# Patient Record
Sex: Male | Born: 1940 | Race: White | Hispanic: No | Marital: Married | Smoking: Never smoker
Health system: Southern US, Community
[De-identification: ages and names within clinical notes are randomized; demographics above are authoritative.]

## PROBLEM LIST (undated history)

## (undated) DIAGNOSIS — I82409 Acute embolism and thrombosis of unspecified deep veins of unspecified lower extremity: Secondary | ICD-10-CM

## (undated) HISTORY — PX: HERNIA REPAIR: SHX51

---

## 1998-02-04 ENCOUNTER — Ambulatory Visit (HOSPITAL_COMMUNITY): Admission: RE | Admit: 1998-02-04 | Discharge: 1998-02-04 | Payer: Self-pay | Admitting: *Deleted

## 2000-04-09 ENCOUNTER — Encounter: Payer: Self-pay | Admitting: Urology

## 2000-04-16 ENCOUNTER — Inpatient Hospital Stay (HOSPITAL_COMMUNITY): Admission: RE | Admit: 2000-04-16 | Discharge: 2000-04-18 | Payer: Self-pay | Admitting: Urology

## 2002-05-11 ENCOUNTER — Ambulatory Visit (HOSPITAL_COMMUNITY): Admission: RE | Admit: 2002-05-11 | Discharge: 2002-05-11 | Payer: Self-pay | Admitting: Emergency Medicine

## 2004-05-31 ENCOUNTER — Ambulatory Visit: Payer: Self-pay | Admitting: *Deleted

## 2004-06-02 ENCOUNTER — Ambulatory Visit: Payer: Self-pay

## 2011-02-09 DIAGNOSIS — J029 Acute pharyngitis, unspecified: Secondary | ICD-10-CM | POA: Diagnosis not present

## 2011-02-09 DIAGNOSIS — J069 Acute upper respiratory infection, unspecified: Secondary | ICD-10-CM | POA: Diagnosis not present

## 2011-02-28 DIAGNOSIS — R05 Cough: Secondary | ICD-10-CM | POA: Diagnosis not present

## 2011-02-28 DIAGNOSIS — J329 Chronic sinusitis, unspecified: Secondary | ICD-10-CM | POA: Diagnosis not present

## 2011-02-28 DIAGNOSIS — R059 Cough, unspecified: Secondary | ICD-10-CM | POA: Diagnosis not present

## 2011-04-11 DIAGNOSIS — H40019 Open angle with borderline findings, low risk, unspecified eye: Secondary | ICD-10-CM | POA: Diagnosis not present

## 2011-09-30 DIAGNOSIS — J069 Acute upper respiratory infection, unspecified: Secondary | ICD-10-CM | POA: Diagnosis not present

## 2011-10-24 DIAGNOSIS — H40039 Anatomical narrow angle, unspecified eye: Secondary | ICD-10-CM | POA: Diagnosis not present

## 2011-10-24 DIAGNOSIS — H409 Unspecified glaucoma: Secondary | ICD-10-CM | POA: Diagnosis not present

## 2011-11-11 DIAGNOSIS — Z23 Encounter for immunization: Secondary | ICD-10-CM | POA: Diagnosis not present

## 2012-04-23 DIAGNOSIS — H2510 Age-related nuclear cataract, unspecified eye: Secondary | ICD-10-CM | POA: Diagnosis not present

## 2012-05-19 DIAGNOSIS — L03019 Cellulitis of unspecified finger: Secondary | ICD-10-CM | POA: Diagnosis not present

## 2012-05-19 DIAGNOSIS — L02519 Cutaneous abscess of unspecified hand: Secondary | ICD-10-CM | POA: Diagnosis not present

## 2012-10-29 DIAGNOSIS — H2510 Age-related nuclear cataract, unspecified eye: Secondary | ICD-10-CM | POA: Diagnosis not present

## 2012-10-29 DIAGNOSIS — H40039 Anatomical narrow angle, unspecified eye: Secondary | ICD-10-CM | POA: Diagnosis not present

## 2012-11-25 DIAGNOSIS — Z23 Encounter for immunization: Secondary | ICD-10-CM | POA: Diagnosis not present

## 2013-04-29 DIAGNOSIS — H2510 Age-related nuclear cataract, unspecified eye: Secondary | ICD-10-CM | POA: Diagnosis not present

## 2013-05-06 DIAGNOSIS — H2510 Age-related nuclear cataract, unspecified eye: Secondary | ICD-10-CM | POA: Diagnosis not present

## 2013-05-12 DIAGNOSIS — H2589 Other age-related cataract: Secondary | ICD-10-CM | POA: Diagnosis not present

## 2013-05-12 DIAGNOSIS — H269 Unspecified cataract: Secondary | ICD-10-CM | POA: Diagnosis not present

## 2013-05-12 DIAGNOSIS — H2510 Age-related nuclear cataract, unspecified eye: Secondary | ICD-10-CM | POA: Diagnosis not present

## 2013-05-20 DIAGNOSIS — H2510 Age-related nuclear cataract, unspecified eye: Secondary | ICD-10-CM | POA: Diagnosis not present

## 2013-05-26 DIAGNOSIS — H2510 Age-related nuclear cataract, unspecified eye: Secondary | ICD-10-CM | POA: Diagnosis not present

## 2013-05-26 DIAGNOSIS — H269 Unspecified cataract: Secondary | ICD-10-CM | POA: Diagnosis not present

## 2013-05-26 DIAGNOSIS — H2589 Other age-related cataract: Secondary | ICD-10-CM | POA: Diagnosis not present

## 2013-09-10 DIAGNOSIS — R972 Elevated prostate specific antigen [PSA]: Secondary | ICD-10-CM | POA: Diagnosis not present

## 2013-09-10 DIAGNOSIS — L723 Sebaceous cyst: Secondary | ICD-10-CM | POA: Diagnosis not present

## 2013-09-10 DIAGNOSIS — N529 Male erectile dysfunction, unspecified: Secondary | ICD-10-CM | POA: Diagnosis not present

## 2013-11-03 DIAGNOSIS — Z23 Encounter for immunization: Secondary | ICD-10-CM | POA: Diagnosis not present

## 2013-12-04 DIAGNOSIS — E785 Hyperlipidemia, unspecified: Secondary | ICD-10-CM | POA: Diagnosis not present

## 2013-12-04 DIAGNOSIS — Z125 Encounter for screening for malignant neoplasm of prostate: Secondary | ICD-10-CM | POA: Diagnosis not present

## 2013-12-11 DIAGNOSIS — R972 Elevated prostate specific antigen [PSA]: Secondary | ICD-10-CM | POA: Diagnosis not present

## 2013-12-11 DIAGNOSIS — N529 Male erectile dysfunction, unspecified: Secondary | ICD-10-CM | POA: Diagnosis not present

## 2013-12-11 DIAGNOSIS — N182 Chronic kidney disease, stage 2 (mild): Secondary | ICD-10-CM | POA: Diagnosis not present

## 2014-01-12 DIAGNOSIS — Z961 Presence of intraocular lens: Secondary | ICD-10-CM | POA: Diagnosis not present

## 2014-01-12 DIAGNOSIS — H26493 Other secondary cataract, bilateral: Secondary | ICD-10-CM | POA: Diagnosis not present

## 2014-09-24 ENCOUNTER — Emergency Department (HOSPITAL_BASED_OUTPATIENT_CLINIC_OR_DEPARTMENT_OTHER)
Admission: EM | Admit: 2014-09-24 | Discharge: 2014-09-24 | Disposition: A | Discharge status: Home / Self Care | Payer: Medicare Other | Attending: Emergency Medicine | Admitting: Emergency Medicine

## 2014-09-24 ENCOUNTER — Emergency Department (HOSPITAL_BASED_OUTPATIENT_CLINIC_OR_DEPARTMENT_OTHER): Payer: Medicare Other

## 2014-09-24 ENCOUNTER — Encounter (HOSPITAL_BASED_OUTPATIENT_CLINIC_OR_DEPARTMENT_OTHER): Payer: Self-pay | Admitting: *Deleted

## 2014-09-24 DIAGNOSIS — N201 Calculus of ureter: Secondary | ICD-10-CM

## 2014-09-24 DIAGNOSIS — N132 Hydronephrosis with renal and ureteral calculous obstruction: Secondary | ICD-10-CM | POA: Diagnosis not present

## 2014-09-24 DIAGNOSIS — R1032 Left lower quadrant pain: Secondary | ICD-10-CM | POA: Diagnosis present

## 2014-09-24 DIAGNOSIS — Z7982 Long term (current) use of aspirin: Secondary | ICD-10-CM | POA: Diagnosis not present

## 2014-09-24 DIAGNOSIS — Z86718 Personal history of other venous thrombosis and embolism: Secondary | ICD-10-CM | POA: Diagnosis not present

## 2014-09-24 HISTORY — DX: Acute embolism and thrombosis of unspecified deep veins of unspecified lower extremity: I82.409

## 2014-09-24 LAB — COMPREHENSIVE METABOLIC PANEL
ALT: 18 U/L (ref 17–63)
AST: 21 U/L (ref 15–41)
Albumin: 4.5 g/dL (ref 3.5–5.0)
Alkaline Phosphatase: 91 U/L (ref 38–126)
Anion gap: 9 (ref 5–15)
BUN: 22 mg/dL — ABNORMAL HIGH (ref 6–20)
CO2: 27 mmol/L (ref 22–32)
Calcium: 9.2 mg/dL (ref 8.9–10.3)
Chloride: 106 mmol/L (ref 101–111)
Creatinine, Ser: 1.15 mg/dL (ref 0.61–1.24)
GFR calc Af Amer: >60 mL/min (ref >60)
GFR calc non Af Amer: >60 mL/min (ref >60)
Glucose, Bld: 168 mg/dL — ABNORMAL HIGH (ref 65–99)
Potassium: 4 mmol/L (ref 3.5–5.1)
Sodium: 142 mmol/L (ref 135–145)
Total Bilirubin: 0.7 mg/dL (ref 0.3–1.2)
Total Protein: 7.4 g/dL (ref 6.5–8.1)

## 2014-09-24 LAB — URINE MICROSCOPIC-ADD ON

## 2014-09-24 LAB — CBC WITH DIFFERENTIAL/PLATELET
Basophils Absolute: 0 10*3/uL (ref 0.0–0.1)
Basophils Relative: 0 % (ref 0–1)
Eosinophils Absolute: 0 10*3/uL (ref 0.0–0.7)
Eosinophils Relative: 0 % (ref 0–5)
HCT: 43.2 % (ref 39.0–52.0)
Hemoglobin: 14.2 g/dL (ref 13.0–17.0)
Lymphocytes Relative: 11 % — ABNORMAL LOW (ref 12–46)
Lymphs Abs: 1.1 10*3/uL (ref 0.7–4.0)
MCH: 27.6 pg (ref 26.0–34.0)
MCHC: 32.9 g/dL (ref 30.0–36.0)
MCV: 83.9 fL (ref 78.0–100.0)
Monocytes Absolute: 0.5 10*3/uL (ref 0.1–1.0)
Monocytes Relative: 5 % (ref 3–12)
Neutro Abs: 8.8 10*3/uL — ABNORMAL HIGH (ref 1.7–7.7)
Neutrophils Relative %: 84 % — ABNORMAL HIGH (ref 43–77)
Platelets: 191 10*3/uL (ref 150–400)
RBC: 5.15 MIL/uL (ref 4.22–5.81)
RDW: 14.4 % (ref 11.5–15.5)
WBC: 10.5 10*3/uL (ref 4.0–10.5)

## 2014-09-24 LAB — URINALYSIS, ROUTINE W REFLEX MICROSCOPIC
Bilirubin Urine: NEGATIVE
Glucose, UA: NEGATIVE mg/dL
Ketones, ur: 15 mg/dL — AB
Nitrite: NEGATIVE
Protein, ur: 30 mg/dL — AB
Specific Gravity, Urine: 1.027 (ref 1.005–1.030)
Urobilinogen, UA: 1 mg/dL (ref 0.0–1.0)
pH: 5.5 (ref 5.0–8.0)

## 2014-09-24 LAB — LIPASE, BLOOD: Lipase: 16 U/L — ABNORMAL LOW (ref 22–51)

## 2014-09-24 MED ORDER — TAMSULOSIN HCL 0.4 MG PO CAPS: 0.4000 mg | ORAL_CAPSULE | Freq: Every day | ORAL | Status: AC

## 2014-09-24 MED ORDER — ONDANSETRON HCL 4 MG PO TABS: 4.0000 mg | ORAL_TABLET | Freq: Four times a day (QID) | ORAL | Status: DC

## 2014-09-24 MED ORDER — IOHEXOL 300 MG/ML  SOLN
25.0000 mL | Freq: Once | INTRAMUSCULAR | Status: AC | PRN
  Administered 2014-09-24: 25 mL via ORAL

## 2014-09-24 MED ORDER — MORPHINE SULFATE (PF) 4 MG/ML IV SOLN
4.0000 mg | Freq: Once | INTRAVENOUS | Status: AC
  Administered 2014-09-24: 4 mg via INTRAVENOUS
  Filled 2014-09-24: qty 1

## 2014-09-24 MED ORDER — KETOROLAC TROMETHAMINE 30 MG/ML IJ SOLN
15.0000 mg | Freq: Once | INTRAMUSCULAR | Status: AC
  Administered 2014-09-24: 15 mg via INTRAVENOUS
  Filled 2014-09-24: qty 1

## 2014-09-24 MED ORDER — MORPHINE SULFATE (PF) 4 MG/ML IV SOLN
4.0000 mg | Freq: Once | INTRAVENOUS | Status: DC
  Filled 2014-09-24: qty 1

## 2014-09-24 MED ORDER — MORPHINE SULFATE (PF) 4 MG/ML IV SOLN
4.0000 mg | Freq: Once | INTRAVENOUS | Status: AC
  Administered 2014-09-24: 4 mg via INTRAVENOUS

## 2014-09-24 MED ORDER — SODIUM CHLORIDE 0.9 % IV BOLUS (SEPSIS): 1000.0000 mL | Freq: Once | INTRAVENOUS | Status: DC

## 2014-09-24 MED ORDER — IBUPROFEN 800 MG PO TABS: 800.0000 mg | ORAL_TABLET | Freq: Three times a day (TID) | ORAL | Status: AC

## 2014-09-24 MED ORDER — OXYCODONE-ACETAMINOPHEN 5-325 MG PO TABS: 2.0000 | ORAL_TABLET | ORAL | Status: DC | PRN

## 2014-09-24 MED ORDER — ONDANSETRON HCL 4 MG/2ML IJ SOLN
4.0000 mg | Freq: Once | INTRAMUSCULAR | Status: AC
  Administered 2014-09-24: 4 mg via INTRAVENOUS
  Filled 2014-09-24: qty 2

## 2014-09-24 MED ORDER — IOHEXOL 300 MG/ML  SOLN
100.0000 mL | Freq: Once | INTRAMUSCULAR | Status: AC | PRN
  Administered 2014-09-24: 100 mL via INTRAVENOUS

## 2014-09-24 MED ORDER — SODIUM CHLORIDE 0.9 % IV BOLUS (SEPSIS)
1000.0000 mL | Freq: Once | INTRAVENOUS | Status: AC
  Administered 2014-09-24: 1000 mL via INTRAVENOUS

## 2014-09-24 NOTE — ED Notes (Signed)
Patient vomited approximately 278mL after returning from CT

## 2014-09-24 NOTE — ED Notes (Signed)
Reports LLQ pain since Monday- last BM yesterday was normal per pt report. Pt vomited x 3 in last hour. Also states had not voided since 0200 this morning

## 2014-09-24 NOTE — ED Provider Notes (Signed)
CSN: [id number]     Arrival date & time 09/24/14  1920 History  This chart was scribed for Ezequiel Essex, MD by Tula Nakayama, ED Scribe. This patient was seen in room MH04/MH04 and the patient's care was started at 7:47 PM.    Chief Complaint  Patient presents with  . Abdominal Pain  . Emesis   The history is provided by the patient. No language interpreter was used.   HPI Comments: James Lester is a 74 y.o. male with a history of hernia repair who presents to the Emergency Department complaining of constant, moderate LLQ abdominal pain that started 1 week ago and became worse 4 hours ago. Pt reports 3 episodes of vomiting and decreased urine as associated symptoms. He states that he has not urinated all day and has not felt the urge to urinate. Pt denies alleviating or relieving factors.Pt ate 3 hours ago, but was unable to tolerate food intake. His last BM was last night. Pt had left hernia repair 15 years ago, but does not know if mesh was placed. He takes 1 Aspirin daily. Pt denies CP, SOB, back pain, testicular pain and flatulence as associated symptoms.  PCP Noah Delaine  Past Medical History  Diagnosis Date  . DVT (deep venous thrombosis)    Past Surgical History  Procedure Laterality Date  . Hernia repair     No family history on file. Social History  Substance Use Topics  . Smoking status: Never Smoker   . Smokeless tobacco: Never Used  . Alcohol Use: No    Review of Systems  Respiratory: Negative for shortness of breath.   Cardiovascular: Negative for chest pain.  Gastrointestinal: Positive for vomiting and abdominal pain.  Genitourinary: Positive for decreased urine volume. Negative for testicular pain.  Musculoskeletal: Negative for back pain.  All other systems reviewed and are negative.  Allergies  Review of patient's allergies indicates no known allergies.  Home Medications   Prior to Admission medications   Medication Sig Start Date End Date Taking?  Authorizing Provider  aspirin 81 MG tablet Take 81 mg by mouth daily.   Yes Historical Provider, MD  ibuprofen (ADVIL,MOTRIN) 800 MG tablet Take 1 tablet (800 mg total) by mouth 3 (three) times daily. 09/24/14   Ezequiel Essex, MD  ondansetron (ZOFRAN) 4 MG tablet Take 1 tablet (4 mg total) by mouth every 6 (six) hours. 09/24/14   Ezequiel Essex, MD  oxyCODONE-acetaminophen (PERCOCET/ROXICET) 5-325 MG per tablet Take 2 tablets by mouth every 4 (four) hours as needed for severe pain. 09/24/14   Ezequiel Essex, MD  tamsulosin (FLOMAX) 0.4 MG CAPS capsule Take 1 capsule (0.4 mg total) by mouth daily. 09/24/14   Ezequiel Essex, MD   BP 139/68 mmHg  Pulse 68  Temp(Src) 98.2 F (36.8 C) (Oral)  Resp 16  Ht 5\' 11"  (1.803 m)  Wt 188 lb (85.276 kg)  BMI 26.23 kg/m2  SpO2 96% Physical Exam  Constitutional: He is oriented to person, place, and time. He appears well-developed and well-nourished. No distress.  HENT:  Head: Normocephalic and atraumatic.  Mouth/Throat: Oropharynx is clear and moist. No oropharyngeal exudate.  Eyes: Conjunctivae and EOM are normal. Pupils are equal, round, and reactive to light.  Neck: Normal range of motion. Neck supple.  No meningismus.  Cardiovascular: Normal rate, regular rhythm, normal heart sounds and intact distal pulses.   No murmur heard. Pulmonary/Chest: Effort normal and breath sounds normal. No respiratory distress.  Abdominal: Soft. There is tenderness. There is no  rebound, no guarding and no CVA tenderness.  TTP LLQ and inguinal area  Genitourinary:  Testicles non-tender Right testicle higher than left, pt states chronic  Musculoskeletal: Normal range of motion. He exhibits no edema or tenderness.  Neurological: He is alert and oriented to person, place, and time. No cranial nerve deficit. He exhibits normal muscle tone. Coordination normal.  No ataxia on finger to nose bilaterally. No pronator drift. 5/5 strength throughout. CN 2-12 intact. Negative  Romberg. Equal grip strength. Sensation intact. Gait is normal.   Skin: Skin is warm.  Psychiatric: He has a normal mood and affect. His behavior is normal.  Nursing note and vitals reviewed.   ED Course  Procedures   DIAGNOSTIC STUDIES: Oxygen Saturation is 98% on RA, normal by my interpretation.    COORDINATION OF CARE: 7:53 PM Discussed treatment plan with pt which includes lab work. Pt agreed to plan.  Labs Review Labs Reviewed  CBC WITH DIFFERENTIAL/PLATELET - Abnormal; Notable for the following:    Neutrophils Relative % 84 (*)    Neutro Abs 8.8 (*)    Lymphocytes Relative 11 (*)    All other components within normal limits  COMPREHENSIVE METABOLIC PANEL - Abnormal; Notable for the following:    Glucose, Bld 168 (*)    BUN 22 (*)    All other components within normal limits  URINALYSIS, ROUTINE W REFLEX MICROSCOPIC (NOT AT [facility]) - Abnormal; Notable for the following:    Color, Urine AMBER (*)    APPearance CLOUDY (*)    Hgb urine dipstick LARGE (*)    Ketones, ur 15 (*)    Protein, ur 30 (*)    Leukocytes, UA TRACE (*)    All other components within normal limits  LIPASE, BLOOD - Abnormal; Notable for the following:    Lipase 16 (*)    All other components within normal limits  URINE MICROSCOPIC-ADD ON - Abnormal; Notable for the following:    Squamous Epithelial / LPF FEW (*)    All other components within normal limits    Imaging Review Ct Abdomen Pelvis W Contrast  09/24/2014   CLINICAL DATA:  Lower abdominal pain for 1 week.  Nausea vomiting.  EXAM: CT ABDOMEN AND PELVIS WITH CONTRAST  TECHNIQUE: Multidetector CT imaging of the abdomen and pelvis was performed using the standard protocol following bolus administration of intravenous contrast.  CONTRAST:  61mL OMNIPAQUE IOHEXOL 300 MG/ML SOLN, 163mL OMNIPAQUE IOHEXOL 300 MG/ML SOLN  COMPARISON:  None.  FINDINGS: There is a 5 mm left ureteral calculus at the low L4 level with moderate hydroureter and hydronephrosis.  There are at least 2 upper pole left collecting system calculi measuring 2 mm each. No right-sided urinary calculi are evident.  There are normal appearances of the liver, gallbladder, pancreas, spleen, adrenals and right kidney. There is a small hiatal hernia. Remainder of the bowel is unremarkable. The abdominal aorta is normal in caliber without atherosclerotic calcification. 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 There is no significant abnormality in the lower chest. There is mild benign appearing anterior wedging of T12. There is moderate lumbar degenerative disc and facet change, greatest at L4-5. No significant musculoskeletal lesions are evident. There is a tiny fat containing umbilical hernia.  IMPRESSION: 1. 5 mm obstructing left ureteral calculus at the low L4 level. Moderate hydronephrosis. 2. Left nephrolithiasis. 3. 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 4. Tiny fat containing umbilical hernia.  Electronically Signed   By: Andreas Newport M.D.   On: 09/24/2014 22:07     EKG Interpretation None      MDM   Final diagnoses:  Ureteral stone   Left-sided abdominal pain with nausea and vomiting. History of inguinal hernia repair.  UA with hematuria. No infection. 100 cc on bladder scan.  CT scan shows 5 mm mid ureteral stone with hydronephrosis. Creatinine is normal. UA is not infected.  Discussed with on-call urology Dr. Nyoka Cowden. He feels patient can be discharged if symptoms are controlled.  Patient tolerating by mouth and pain is controlled. He wishes to go home. Admission offered and declined. Instructed to call for urology follow-up tomorrow. Return precautions discussed including worsening pain, vomiting, fever, uncontrolled symptoms.  I personally performed the services described in this documentation, which was scribed in my presence. The recorded information has been reviewed and is  accurate.   Ezequiel Essex, MD 09/25/14 Laureen Abrahams

## 2014-09-24 NOTE — ED Notes (Signed)
MD at bedside. 

## 2014-09-24 NOTE — ED Notes (Signed)
Patient given water for PO challenge.  

## 2014-09-24 NOTE — Discharge Instructions (Signed)
Kidney Stones Follow up with the urologist. Return to the ED with worsening pain, vomiting, fevers or any other concerns. Kidney stones (urolithiasis) are deposits that form inside your kidneys. The intense pain is caused by the stone moving through the urinary tract. When the stone moves, the ureter goes into spasm around the stone. The stone is usually passed in the urine.  CAUSES   A disorder that makes certain neck glands produce too much parathyroid hormone (primary hyperparathyroidism).  A buildup of uric acid crystals, similar to gout in your joints.  Narrowing (stricture) of the ureter.  A kidney obstruction present at birth (congenital obstruction).  Previous surgery on the kidney or ureters.  Numerous kidney infections. SYMPTOMS   Feeling sick to your stomach (nauseous).  Throwing up (vomiting).  Blood in the urine (hematuria).  Pain that usually spreads (radiates) to the groin.  Frequency or urgency of urination. DIAGNOSIS   Taking a history and physical exam.  Blood or urine tests.  CT scan.  Occasionally, an examination of the inside of the urinary bladder (cystoscopy) is performed. TREATMENT   Observation.  Increasing your fluid intake.  Extracorporeal shock wave lithotripsy--This is a noninvasive procedure that uses shock waves to break up kidney stones.  Surgery may be needed if you have severe pain or persistent obstruction. There are various surgical procedures. Most of the procedures are performed with the use of small instruments. Only small incisions are needed to accommodate these instruments, so recovery time is minimized. The size, location, and chemical composition are all important variables that will determine the proper choice of action for you. Talk to your health care provider to better understand your situation so that you will minimize the risk of injury to yourself and your kidney.  HOME CARE INSTRUCTIONS   Drink enough water and fluids  to keep your urine clear or pale yellow. This will help you to pass the stone or stone fragments.  Strain all urine through the provided strainer. Keep all particulate matter and stones for your health care provider to see. The stone causing the pain may be as small as a grain of salt. It is very important to use the strainer each and every time you pass your urine. The collection of your stone will allow your health care provider to analyze it and verify that a stone has actually passed. The stone analysis will often identify what you can do to reduce the incidence of recurrences.  Only take over-the-counter or prescription medicines for pain, discomfort, or fever as directed by your health care provider.  Make a follow-up appointment with your health care provider as directed.  Get follow-up X-rays if required. The absence of pain does not always mean that the stone has passed. It may have only stopped moving. If the urine remains completely obstructed, it can cause loss of kidney function or even complete destruction of the kidney. It is your responsibility to make sure X-rays and follow-ups are completed. Ultrasounds of the kidney can show blockages and the status of the kidney. Ultrasounds are not associated with any radiation and can be performed easily in a matter of minutes. SEEK MEDICAL CARE IF:  You experience pain that is progressive and unresponsive to any pain medicine you have been prescribed. SEEK IMMEDIATE MEDICAL CARE IF:   Pain cannot be controlled with the prescribed medicine.  You have a fever or shaking chills.  The severity or intensity of pain increases over 18 hours and is not relieved by  pain medicine.  You develop a new onset of abdominal pain.  You feel faint or pass out.  You are unable to urinate. MAKE SURE YOU:   Understand these instructions.  Will watch your condition.  Will get help right away if you are not doing well or get worse. Document Released:  01/09/2005 Document Revised: 09/11/2012 Document Reviewed: 06/12/2012 [facility] Patient Information 2015 [place], [place]. This information is not intended to replace advice given to you by your health care provider. Make sure you discuss any questions you have with your health care provider.

## 2014-09-29 DIAGNOSIS — N1330 Unspecified hydronephrosis: Secondary | ICD-10-CM | POA: Diagnosis not present

## 2014-09-29 DIAGNOSIS — N201 Calculus of ureter: Secondary | ICD-10-CM | POA: Diagnosis not present

## 2014-10-12 DIAGNOSIS — N201 Calculus of ureter: Secondary | ICD-10-CM | POA: Diagnosis not present

## 2014-11-02 DIAGNOSIS — N201 Calculus of ureter: Secondary | ICD-10-CM | POA: Diagnosis not present

## 2014-12-14 DIAGNOSIS — E663 Overweight: Secondary | ICD-10-CM | POA: Diagnosis not present

## 2014-12-14 DIAGNOSIS — Z0000 Encounter for general adult medical examination without abnormal findings: Secondary | ICD-10-CM | POA: Diagnosis not present

## 2014-12-14 DIAGNOSIS — E785 Hyperlipidemia, unspecified: Secondary | ICD-10-CM | POA: Diagnosis not present

## 2014-12-14 DIAGNOSIS — Z125 Encounter for screening for malignant neoplasm of prostate: Secondary | ICD-10-CM | POA: Diagnosis not present

## 2014-12-21 DIAGNOSIS — Z125 Encounter for screening for malignant neoplasm of prostate: Secondary | ICD-10-CM | POA: Diagnosis not present

## 2014-12-21 DIAGNOSIS — N529 Male erectile dysfunction, unspecified: Secondary | ICD-10-CM | POA: Diagnosis not present

## 2014-12-21 DIAGNOSIS — N182 Chronic kidney disease, stage 2 (mild): Secondary | ICD-10-CM | POA: Diagnosis not present

## 2014-12-21 DIAGNOSIS — R972 Elevated prostate specific antigen [PSA]: Secondary | ICD-10-CM | POA: Diagnosis not present

## 2015-01-15 DIAGNOSIS — H26493 Other secondary cataract, bilateral: Secondary | ICD-10-CM | POA: Diagnosis not present

## 2015-01-15 DIAGNOSIS — H02831 Dermatochalasis of right upper eyelid: Secondary | ICD-10-CM | POA: Diagnosis not present

## 2015-01-15 DIAGNOSIS — H02834 Dermatochalasis of left upper eyelid: Secondary | ICD-10-CM | POA: Diagnosis not present

## 2015-09-22 ENCOUNTER — Other Ambulatory Visit: Payer: Self-pay

## 2015-10-22 DIAGNOSIS — K045 Chronic apical periodontitis: Secondary | ICD-10-CM | POA: Diagnosis not present

## 2015-10-25 DIAGNOSIS — Z23 Encounter for immunization: Secondary | ICD-10-CM | POA: Diagnosis not present

## 2015-11-05 DIAGNOSIS — N200 Calculus of kidney: Secondary | ICD-10-CM | POA: Diagnosis not present

## 2015-11-05 DIAGNOSIS — N400 Enlarged prostate without lower urinary tract symptoms: Secondary | ICD-10-CM | POA: Diagnosis not present

## 2015-12-15 DIAGNOSIS — R946 Abnormal results of thyroid function studies: Secondary | ICD-10-CM | POA: Diagnosis not present

## 2015-12-15 DIAGNOSIS — Z125 Encounter for screening for malignant neoplasm of prostate: Secondary | ICD-10-CM | POA: Diagnosis not present

## 2015-12-15 DIAGNOSIS — R972 Elevated prostate specific antigen [PSA]: Secondary | ICD-10-CM | POA: Diagnosis not present

## 2015-12-15 DIAGNOSIS — N529 Male erectile dysfunction, unspecified: Secondary | ICD-10-CM | POA: Diagnosis not present

## 2015-12-15 DIAGNOSIS — N182 Chronic kidney disease, stage 2 (mild): Secondary | ICD-10-CM | POA: Diagnosis not present

## 2015-12-22 DIAGNOSIS — Z0000 Encounter for general adult medical examination without abnormal findings: Secondary | ICD-10-CM | POA: Diagnosis not present

## 2015-12-22 DIAGNOSIS — R946 Abnormal results of thyroid function studies: Secondary | ICD-10-CM | POA: Diagnosis not present

## 2015-12-22 DIAGNOSIS — N182 Chronic kidney disease, stage 2 (mild): Secondary | ICD-10-CM | POA: Diagnosis not present

## 2015-12-22 DIAGNOSIS — N529 Male erectile dysfunction, unspecified: Secondary | ICD-10-CM | POA: Diagnosis not present

## 2015-12-22 DIAGNOSIS — R972 Elevated prostate specific antigen [PSA]: Secondary | ICD-10-CM | POA: Diagnosis not present

## 2016-01-21 DIAGNOSIS — H26493 Other secondary cataract, bilateral: Secondary | ICD-10-CM | POA: Diagnosis not present

## 2016-01-21 DIAGNOSIS — H02834 Dermatochalasis of left upper eyelid: Secondary | ICD-10-CM | POA: Diagnosis not present

## 2016-01-21 DIAGNOSIS — H02831 Dermatochalasis of right upper eyelid: Secondary | ICD-10-CM | POA: Diagnosis not present

## 2016-05-15 IMAGING — CT CT ABD-PELV W/ CM
1 of 3 series · 14 of 32 positions shown, 19 images · IV contrast (omnipaque)
Comparison: None.

CLINICAL DATA: Lower abdominal pain for 1 week.  Nausea vomiting.

EXAM:
CT ABDOMEN AND PELVIS WITH CONTRAST
TECHNIQUE: Multidetector CT imaging of the abdomen and pelvis was performed
using the standard protocol following bolus administration of
intravenous contrast.
CONTRAST:  25mL OMNIPAQUE IOHEXOL 300 MG/ML SOLN, 100mL OMNIPAQUE
IOHEXOL 300 MG/ML SOLN

[Series 2: abd/pelvis 5.0 b31f · axial · 0.72mm/px · z∈[-470,-30]mm · 14 of 100 slices shown, 19 images]
[im 6/100  soft-tissue]
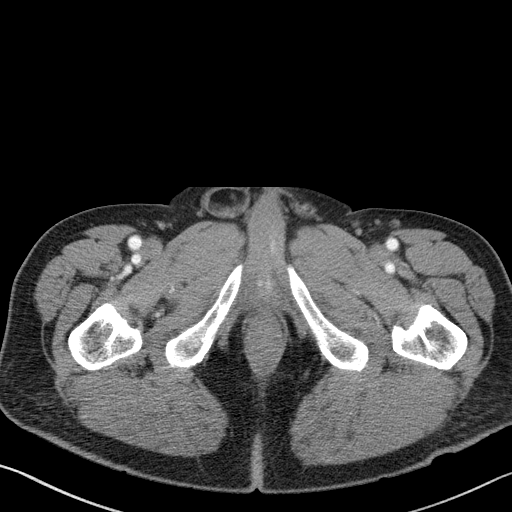
[im 6/100  bone]
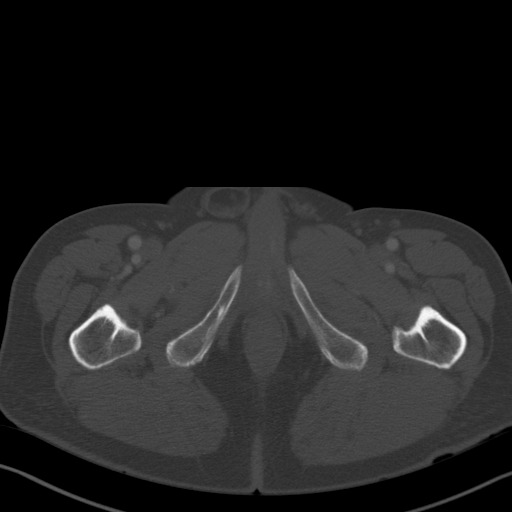
[im 16/100  soft-tissue]
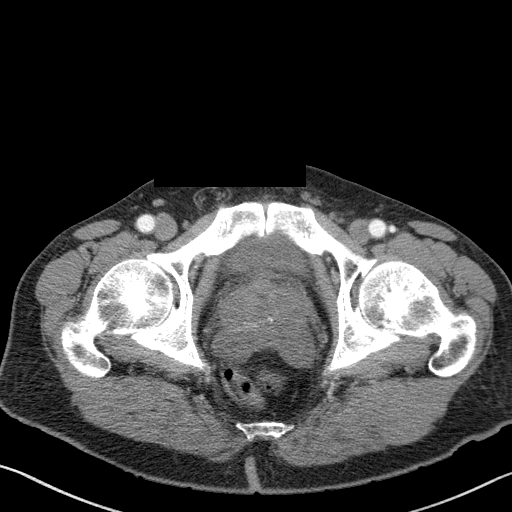
[im 21/100  soft-tissue]
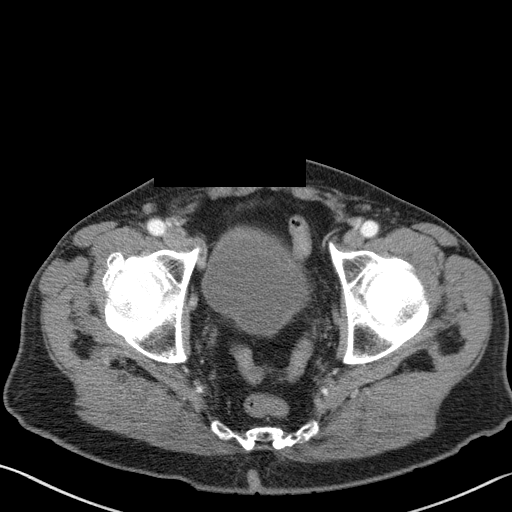
[im 27/100  soft-tissue]
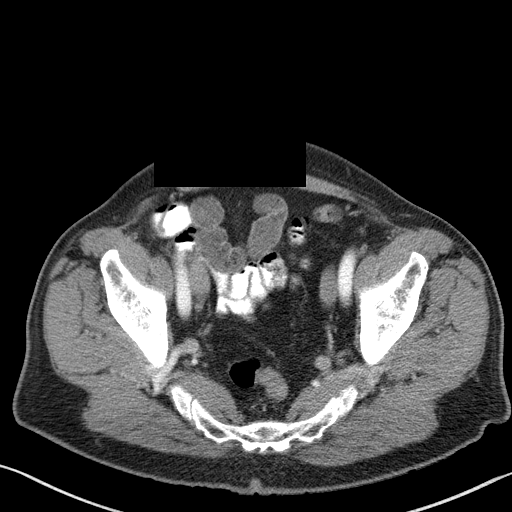
[im 37/100  soft-tissue]
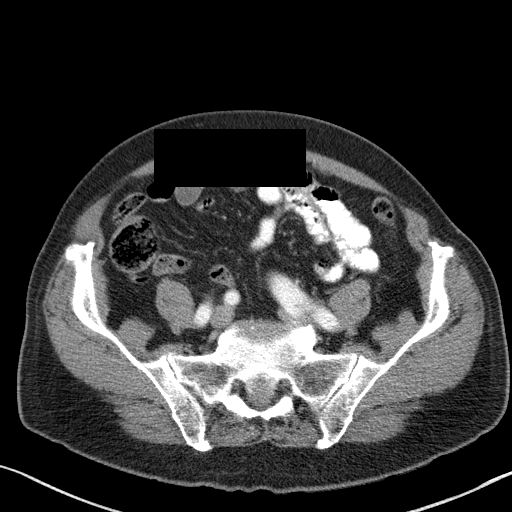
[im 42/100  soft-tissue]
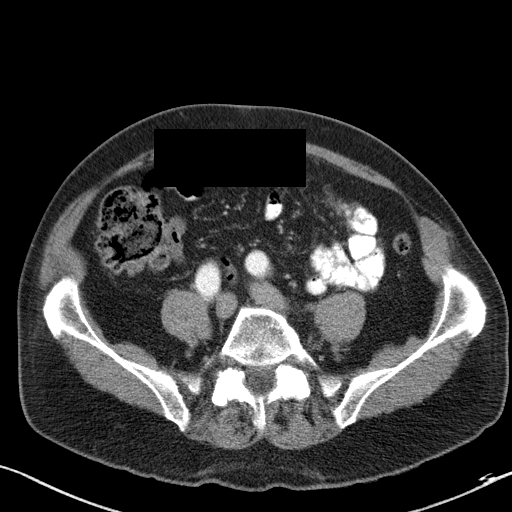
[im 53/100  soft-tissue]
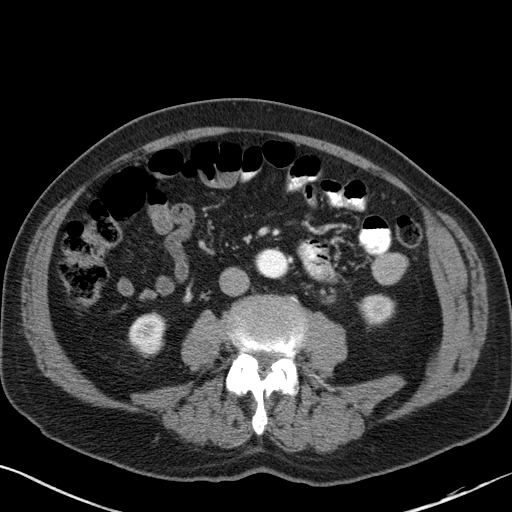
[im 58/100  soft-tissue]
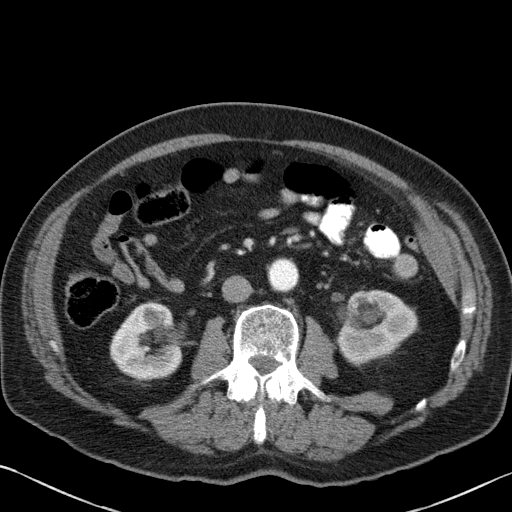
[im 63/100  soft-tissue]
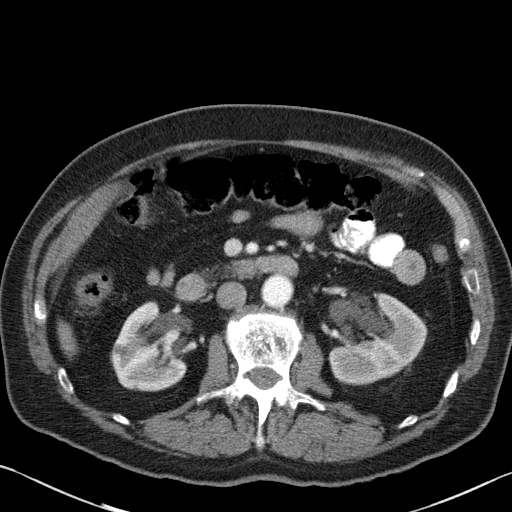
[im 63/100  bone]
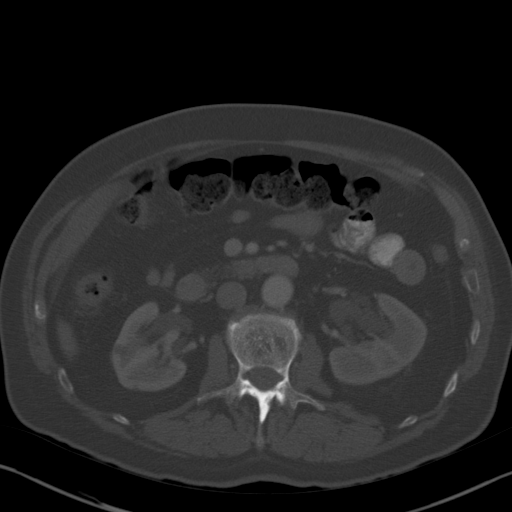
[im 73/100  soft-tissue]
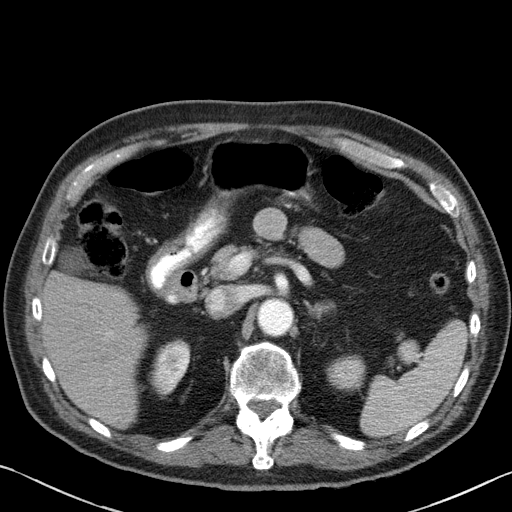
[im 79/100  soft-tissue]
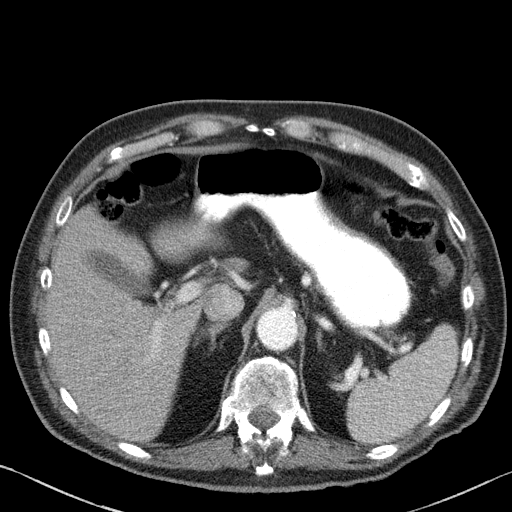
[im 79/100  lung]
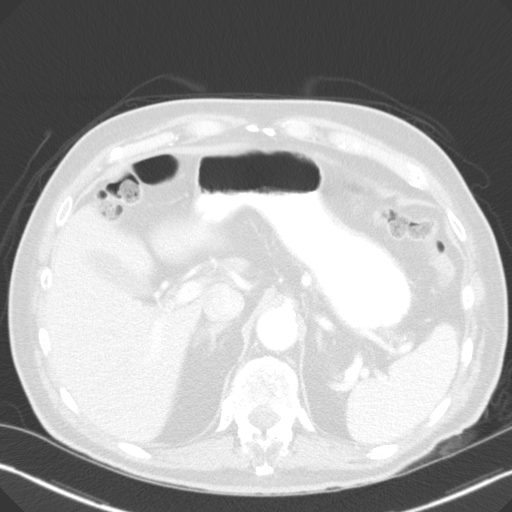
[im 84/100  soft-tissue]
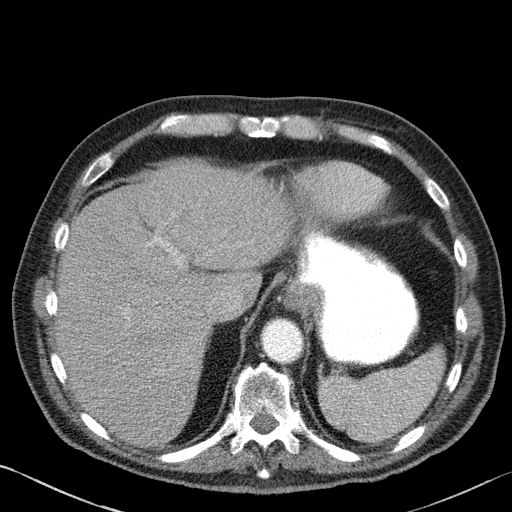
[im 84/100  lung]
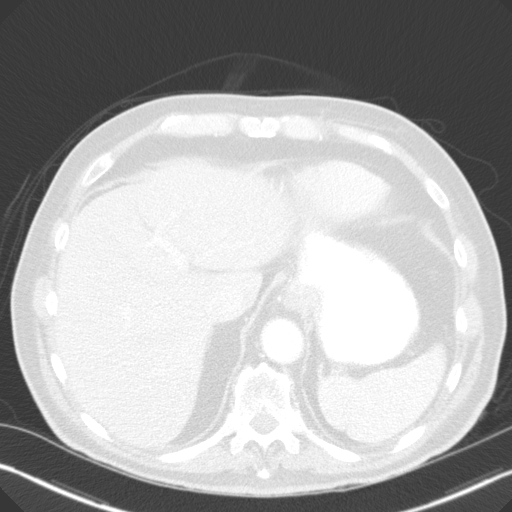
[im 89/100  lung]
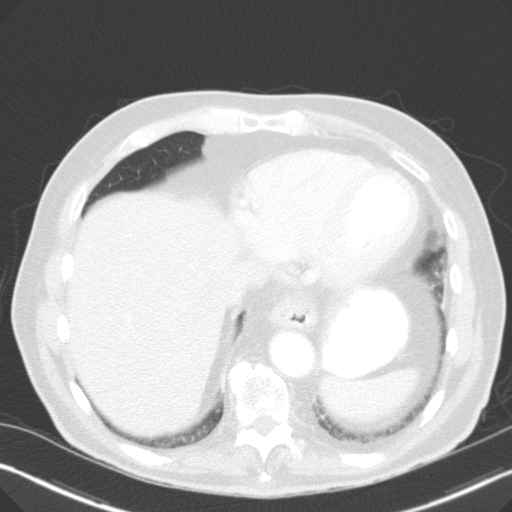
[im 94/100  soft-tissue]
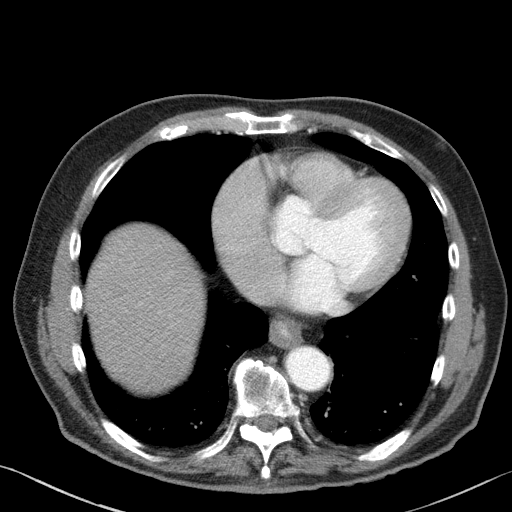
[im 94/100  lung]
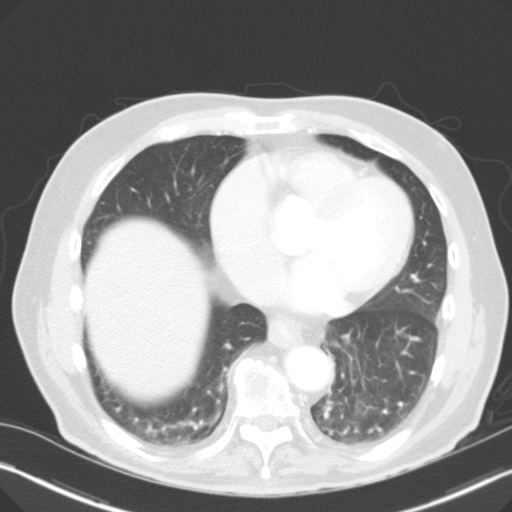

[14 of 32 positions shown; findings below may reference images not displayed]

FINDINGS: There is a 5 mm left ureteral calculus at the low L4 level with
moderate hydroureter and hydronephrosis. There are at least 2 upper
pole left collecting system calculi measuring 2 mm each. No
right-sided urinary calculi are evident.

There are normal appearances of the liver, gallbladder, pancreas,
spleen, adrenals and right kidney. There is a small hiatal hernia.
Remainder of the bowel is unremarkable. The abdominal aorta is
normal in caliber without atherosclerotic calcification. There is
mild aneurysmal enlargement of the common iliac arteries, 1.5 cm.
There is no significant abnormality in the lower chest. There is
mild benign appearing anterior wedging of T12. There is moderate
lumbar degenerative disc and facet change, greatest at L4-5. No
significant musculoskeletal lesions are evident. There is a tiny fat
containing umbilical hernia.
IMPRESSION: 1. 5 mm obstructing left ureteral calculus at the low L4 level.
Moderate hydronephrosis.
2. Left nephrolithiasis.
3. Mild aneurysmal enlargement of the common iliac arteries
bilaterally, 1.5 cm.
4. Tiny fat containing umbilical hernia.

## 2016-10-25 DIAGNOSIS — Z23 Encounter for immunization: Secondary | ICD-10-CM | POA: Diagnosis not present

## 2016-12-13 DIAGNOSIS — R972 Elevated prostate specific antigen [PSA]: Secondary | ICD-10-CM | POA: Diagnosis not present

## 2016-12-13 DIAGNOSIS — E785 Hyperlipidemia, unspecified: Secondary | ICD-10-CM | POA: Diagnosis not present

## 2016-12-27 DIAGNOSIS — N529 Male erectile dysfunction, unspecified: Secondary | ICD-10-CM | POA: Diagnosis not present

## 2016-12-27 DIAGNOSIS — R972 Elevated prostate specific antigen [PSA]: Secondary | ICD-10-CM | POA: Diagnosis not present

## 2016-12-27 DIAGNOSIS — Z0000 Encounter for general adult medical examination without abnormal findings: Secondary | ICD-10-CM | POA: Diagnosis not present

## 2016-12-27 DIAGNOSIS — R809 Proteinuria, unspecified: Secondary | ICD-10-CM | POA: Diagnosis not present

## 2016-12-27 DIAGNOSIS — R946 Abnormal results of thyroid function studies: Secondary | ICD-10-CM | POA: Diagnosis not present

## 2016-12-27 DIAGNOSIS — N182 Chronic kidney disease, stage 2 (mild): Secondary | ICD-10-CM | POA: Diagnosis not present

## 2017-01-19 DIAGNOSIS — H524 Presbyopia: Secondary | ICD-10-CM | POA: Diagnosis not present

## 2017-01-19 DIAGNOSIS — H26493 Other secondary cataract, bilateral: Secondary | ICD-10-CM | POA: Diagnosis not present

## 2017-01-19 DIAGNOSIS — H02831 Dermatochalasis of right upper eyelid: Secondary | ICD-10-CM | POA: Diagnosis not present

## 2017-01-19 DIAGNOSIS — H5213 Myopia, bilateral: Secondary | ICD-10-CM | POA: Diagnosis not present

## 2017-01-19 DIAGNOSIS — H02834 Dermatochalasis of left upper eyelid: Secondary | ICD-10-CM | POA: Diagnosis not present

## 2017-09-18 DIAGNOSIS — D1801 Hemangioma of skin and subcutaneous tissue: Secondary | ICD-10-CM | POA: Diagnosis not present

## 2017-09-18 DIAGNOSIS — L820 Inflamed seborrheic keratosis: Secondary | ICD-10-CM | POA: Diagnosis not present

## 2017-09-18 DIAGNOSIS — L821 Other seborrheic keratosis: Secondary | ICD-10-CM | POA: Diagnosis not present

## 2017-09-18 DIAGNOSIS — D485 Neoplasm of uncertain behavior of skin: Secondary | ICD-10-CM | POA: Diagnosis not present

## 2017-09-18 DIAGNOSIS — L570 Actinic keratosis: Secondary | ICD-10-CM | POA: Diagnosis not present

## 2017-10-25 DIAGNOSIS — Z23 Encounter for immunization: Secondary | ICD-10-CM | POA: Diagnosis not present

## 2018-01-04 DIAGNOSIS — E785 Hyperlipidemia, unspecified: Secondary | ICD-10-CM | POA: Diagnosis not present

## 2018-01-04 DIAGNOSIS — Z125 Encounter for screening for malignant neoplasm of prostate: Secondary | ICD-10-CM | POA: Diagnosis not present

## 2018-01-04 DIAGNOSIS — R972 Elevated prostate specific antigen [PSA]: Secondary | ICD-10-CM | POA: Diagnosis not present

## 2018-01-11 DIAGNOSIS — Z86718 Personal history of other venous thrombosis and embolism: Secondary | ICD-10-CM | POA: Diagnosis not present

## 2018-01-11 DIAGNOSIS — N529 Male erectile dysfunction, unspecified: Secondary | ICD-10-CM | POA: Diagnosis not present

## 2018-01-11 DIAGNOSIS — Z7982 Long term (current) use of aspirin: Secondary | ICD-10-CM | POA: Diagnosis not present

## 2018-01-11 DIAGNOSIS — I4510 Unspecified right bundle-branch block: Secondary | ICD-10-CM | POA: Diagnosis not present

## 2018-01-11 DIAGNOSIS — Z5320 Procedure and treatment not carried out because of patient's decision for unspecified reasons: Secondary | ICD-10-CM | POA: Diagnosis not present

## 2018-01-11 DIAGNOSIS — Z0000 Encounter for general adult medical examination without abnormal findings: Secondary | ICD-10-CM | POA: Diagnosis not present

## 2018-01-11 DIAGNOSIS — Z1212 Encounter for screening for malignant neoplasm of rectum: Secondary | ICD-10-CM | POA: Diagnosis not present

## 2018-01-11 DIAGNOSIS — E785 Hyperlipidemia, unspecified: Secondary | ICD-10-CM | POA: Diagnosis not present

## 2018-01-11 DIAGNOSIS — L720 Epidermal cyst: Secondary | ICD-10-CM | POA: Diagnosis not present

## 2018-01-25 DIAGNOSIS — H02832 Dermatochalasis of right lower eyelid: Secondary | ICD-10-CM | POA: Diagnosis not present

## 2018-01-25 DIAGNOSIS — H02831 Dermatochalasis of right upper eyelid: Secondary | ICD-10-CM | POA: Diagnosis not present

## 2018-01-25 DIAGNOSIS — H26493 Other secondary cataract, bilateral: Secondary | ICD-10-CM | POA: Diagnosis not present

## 2018-01-25 DIAGNOSIS — Z961 Presence of intraocular lens: Secondary | ICD-10-CM | POA: Diagnosis not present

## 2018-08-21 ENCOUNTER — Other Ambulatory Visit: Payer: Self-pay

## 2018-10-15 DIAGNOSIS — Z23 Encounter for immunization: Secondary | ICD-10-CM | POA: Diagnosis not present

## 2019-03-10 DIAGNOSIS — M1711 Unilateral primary osteoarthritis, right knee: Secondary | ICD-10-CM | POA: Diagnosis not present

## 2019-04-07 DIAGNOSIS — M1711 Unilateral primary osteoarthritis, right knee: Secondary | ICD-10-CM | POA: Diagnosis not present

## 2019-05-06 DIAGNOSIS — M25561 Pain in right knee: Secondary | ICD-10-CM | POA: Diagnosis not present

## 2019-05-06 DIAGNOSIS — M1711 Unilateral primary osteoarthritis, right knee: Secondary | ICD-10-CM | POA: Diagnosis not present

## 2019-05-13 DIAGNOSIS — M1711 Unilateral primary osteoarthritis, right knee: Secondary | ICD-10-CM | POA: Diagnosis not present

## 2019-05-13 DIAGNOSIS — M25561 Pain in right knee: Secondary | ICD-10-CM | POA: Diagnosis not present

## 2019-05-20 DIAGNOSIS — M25561 Pain in right knee: Secondary | ICD-10-CM | POA: Diagnosis not present

## 2019-05-20 DIAGNOSIS — M1711 Unilateral primary osteoarthritis, right knee: Secondary | ICD-10-CM | POA: Diagnosis not present

## 2019-06-19 DIAGNOSIS — M170 Bilateral primary osteoarthritis of knee: Secondary | ICD-10-CM | POA: Diagnosis not present

## 2019-11-11 DIAGNOSIS — M170 Bilateral primary osteoarthritis of knee: Secondary | ICD-10-CM | POA: Diagnosis not present

## 2019-11-11 DIAGNOSIS — M25561 Pain in right knee: Secondary | ICD-10-CM | POA: Diagnosis not present

## 2019-11-14 DIAGNOSIS — Z01812 Encounter for preprocedural laboratory examination: Secondary | ICD-10-CM | POA: Diagnosis not present

## 2019-11-17 DIAGNOSIS — M1711 Unilateral primary osteoarthritis, right knee: Secondary | ICD-10-CM | POA: Diagnosis not present

## 2019-11-17 DIAGNOSIS — Z01818 Encounter for other preprocedural examination: Secondary | ICD-10-CM | POA: Diagnosis not present

## 2019-11-17 DIAGNOSIS — M25561 Pain in right knee: Secondary | ICD-10-CM | POA: Diagnosis not present

## 2019-11-20 DIAGNOSIS — M25561 Pain in right knee: Secondary | ICD-10-CM | POA: Diagnosis not present

## 2019-11-20 DIAGNOSIS — M1711 Unilateral primary osteoarthritis, right knee: Secondary | ICD-10-CM | POA: Diagnosis not present

## 2019-11-26 DIAGNOSIS — M1711 Unilateral primary osteoarthritis, right knee: Secondary | ICD-10-CM | POA: Diagnosis not present

## 2019-11-26 DIAGNOSIS — G8918 Other acute postprocedural pain: Secondary | ICD-10-CM | POA: Diagnosis not present

## 2019-11-27 DIAGNOSIS — M170 Bilateral primary osteoarthritis of knee: Secondary | ICD-10-CM | POA: Diagnosis not present

## 2019-11-27 DIAGNOSIS — Z471 Aftercare following joint replacement surgery: Secondary | ICD-10-CM | POA: Diagnosis not present

## 2019-11-27 DIAGNOSIS — M1712 Unilateral primary osteoarthritis, left knee: Secondary | ICD-10-CM | POA: Diagnosis not present

## 2019-11-27 DIAGNOSIS — Z96651 Presence of right artificial knee joint: Secondary | ICD-10-CM | POA: Diagnosis not present

## 2019-11-27 DIAGNOSIS — I10 Essential (primary) hypertension: Secondary | ICD-10-CM | POA: Diagnosis not present

## 2019-11-27 DIAGNOSIS — Z7982 Long term (current) use of aspirin: Secondary | ICD-10-CM | POA: Diagnosis not present

## 2019-11-28 DIAGNOSIS — Z96651 Presence of right artificial knee joint: Secondary | ICD-10-CM | POA: Diagnosis not present

## 2019-11-28 DIAGNOSIS — I10 Essential (primary) hypertension: Secondary | ICD-10-CM | POA: Diagnosis not present

## 2019-11-28 DIAGNOSIS — M1712 Unilateral primary osteoarthritis, left knee: Secondary | ICD-10-CM | POA: Diagnosis not present

## 2019-11-28 DIAGNOSIS — Z471 Aftercare following joint replacement surgery: Secondary | ICD-10-CM | POA: Diagnosis not present

## 2019-11-28 DIAGNOSIS — Z7982 Long term (current) use of aspirin: Secondary | ICD-10-CM | POA: Diagnosis not present

## 2019-12-01 DIAGNOSIS — Z96651 Presence of right artificial knee joint: Secondary | ICD-10-CM | POA: Diagnosis not present

## 2019-12-01 DIAGNOSIS — Z7982 Long term (current) use of aspirin: Secondary | ICD-10-CM | POA: Diagnosis not present

## 2019-12-01 DIAGNOSIS — I10 Essential (primary) hypertension: Secondary | ICD-10-CM | POA: Diagnosis not present

## 2019-12-01 DIAGNOSIS — M1712 Unilateral primary osteoarthritis, left knee: Secondary | ICD-10-CM | POA: Diagnosis not present

## 2019-12-01 DIAGNOSIS — Z471 Aftercare following joint replacement surgery: Secondary | ICD-10-CM | POA: Diagnosis not present

## 2019-12-03 DIAGNOSIS — Z7982 Long term (current) use of aspirin: Secondary | ICD-10-CM | POA: Diagnosis not present

## 2019-12-03 DIAGNOSIS — Z471 Aftercare following joint replacement surgery: Secondary | ICD-10-CM | POA: Diagnosis not present

## 2019-12-03 DIAGNOSIS — Z96651 Presence of right artificial knee joint: Secondary | ICD-10-CM | POA: Diagnosis not present

## 2019-12-03 DIAGNOSIS — M1712 Unilateral primary osteoarthritis, left knee: Secondary | ICD-10-CM | POA: Diagnosis not present

## 2019-12-03 DIAGNOSIS — I10 Essential (primary) hypertension: Secondary | ICD-10-CM | POA: Diagnosis not present

## 2019-12-05 DIAGNOSIS — M1712 Unilateral primary osteoarthritis, left knee: Secondary | ICD-10-CM | POA: Diagnosis not present

## 2019-12-05 DIAGNOSIS — Z7982 Long term (current) use of aspirin: Secondary | ICD-10-CM | POA: Diagnosis not present

## 2019-12-05 DIAGNOSIS — I10 Essential (primary) hypertension: Secondary | ICD-10-CM | POA: Diagnosis not present

## 2019-12-05 DIAGNOSIS — Z96651 Presence of right artificial knee joint: Secondary | ICD-10-CM | POA: Diagnosis not present

## 2019-12-05 DIAGNOSIS — Z471 Aftercare following joint replacement surgery: Secondary | ICD-10-CM | POA: Diagnosis not present

## 2019-12-09 DIAGNOSIS — Z471 Aftercare following joint replacement surgery: Secondary | ICD-10-CM | POA: Diagnosis not present

## 2019-12-09 DIAGNOSIS — Z96651 Presence of right artificial knee joint: Secondary | ICD-10-CM | POA: Diagnosis not present

## 2019-12-09 DIAGNOSIS — M25561 Pain in right knee: Secondary | ICD-10-CM | POA: Diagnosis not present

## 2019-12-09 DIAGNOSIS — Z9889 Other specified postprocedural states: Secondary | ICD-10-CM | POA: Diagnosis not present

## 2019-12-09 DIAGNOSIS — M6281 Muscle weakness (generalized): Secondary | ICD-10-CM | POA: Diagnosis not present

## 2019-12-10 DIAGNOSIS — Z9889 Other specified postprocedural states: Secondary | ICD-10-CM | POA: Diagnosis not present

## 2019-12-10 DIAGNOSIS — M6281 Muscle weakness (generalized): Secondary | ICD-10-CM | POA: Diagnosis not present

## 2019-12-10 DIAGNOSIS — M25561 Pain in right knee: Secondary | ICD-10-CM | POA: Diagnosis not present

## 2019-12-10 DIAGNOSIS — Z96651 Presence of right artificial knee joint: Secondary | ICD-10-CM | POA: Diagnosis not present

## 2019-12-16 DIAGNOSIS — Z9889 Other specified postprocedural states: Secondary | ICD-10-CM | POA: Diagnosis not present

## 2019-12-16 DIAGNOSIS — Z96651 Presence of right artificial knee joint: Secondary | ICD-10-CM | POA: Diagnosis not present

## 2019-12-16 DIAGNOSIS — M6281 Muscle weakness (generalized): Secondary | ICD-10-CM | POA: Diagnosis not present

## 2019-12-16 DIAGNOSIS — M25561 Pain in right knee: Secondary | ICD-10-CM | POA: Diagnosis not present

## 2019-12-17 DIAGNOSIS — Z9889 Other specified postprocedural states: Secondary | ICD-10-CM | POA: Diagnosis not present

## 2019-12-17 DIAGNOSIS — Z96651 Presence of right artificial knee joint: Secondary | ICD-10-CM | POA: Diagnosis not present

## 2019-12-17 DIAGNOSIS — M25561 Pain in right knee: Secondary | ICD-10-CM | POA: Diagnosis not present

## 2019-12-17 DIAGNOSIS — M6281 Muscle weakness (generalized): Secondary | ICD-10-CM | POA: Diagnosis not present

## 2019-12-22 DIAGNOSIS — M25561 Pain in right knee: Secondary | ICD-10-CM | POA: Diagnosis not present

## 2019-12-22 DIAGNOSIS — Z96651 Presence of right artificial knee joint: Secondary | ICD-10-CM | POA: Diagnosis not present

## 2019-12-22 DIAGNOSIS — M6281 Muscle weakness (generalized): Secondary | ICD-10-CM | POA: Diagnosis not present

## 2019-12-22 DIAGNOSIS — Z9889 Other specified postprocedural states: Secondary | ICD-10-CM | POA: Diagnosis not present

## 2019-12-24 DIAGNOSIS — Z9889 Other specified postprocedural states: Secondary | ICD-10-CM | POA: Diagnosis not present

## 2019-12-24 DIAGNOSIS — Z96651 Presence of right artificial knee joint: Secondary | ICD-10-CM | POA: Diagnosis not present

## 2019-12-24 DIAGNOSIS — M25561 Pain in right knee: Secondary | ICD-10-CM | POA: Diagnosis not present

## 2019-12-24 DIAGNOSIS — M6281 Muscle weakness (generalized): Secondary | ICD-10-CM | POA: Diagnosis not present

## 2019-12-25 DIAGNOSIS — M6281 Muscle weakness (generalized): Secondary | ICD-10-CM | POA: Diagnosis not present

## 2019-12-25 DIAGNOSIS — Z9889 Other specified postprocedural states: Secondary | ICD-10-CM | POA: Diagnosis not present

## 2019-12-25 DIAGNOSIS — M25561 Pain in right knee: Secondary | ICD-10-CM | POA: Diagnosis not present

## 2019-12-25 DIAGNOSIS — Z96651 Presence of right artificial knee joint: Secondary | ICD-10-CM | POA: Diagnosis not present

## 2019-12-27 DIAGNOSIS — M170 Bilateral primary osteoarthritis of knee: Secondary | ICD-10-CM | POA: Diagnosis not present

## 2019-12-29 DIAGNOSIS — Z9889 Other specified postprocedural states: Secondary | ICD-10-CM | POA: Diagnosis not present

## 2019-12-29 DIAGNOSIS — M25561 Pain in right knee: Secondary | ICD-10-CM | POA: Diagnosis not present

## 2019-12-29 DIAGNOSIS — Z96651 Presence of right artificial knee joint: Secondary | ICD-10-CM | POA: Diagnosis not present

## 2019-12-29 DIAGNOSIS — M6281 Muscle weakness (generalized): Secondary | ICD-10-CM | POA: Diagnosis not present

## 2019-12-31 DIAGNOSIS — M6281 Muscle weakness (generalized): Secondary | ICD-10-CM | POA: Diagnosis not present

## 2019-12-31 DIAGNOSIS — Z96651 Presence of right artificial knee joint: Secondary | ICD-10-CM | POA: Diagnosis not present

## 2019-12-31 DIAGNOSIS — M25561 Pain in right knee: Secondary | ICD-10-CM | POA: Diagnosis not present

## 2019-12-31 DIAGNOSIS — Z9889 Other specified postprocedural states: Secondary | ICD-10-CM | POA: Diagnosis not present

## 2020-01-02 DIAGNOSIS — Z96651 Presence of right artificial knee joint: Secondary | ICD-10-CM | POA: Diagnosis not present

## 2020-01-02 DIAGNOSIS — M25561 Pain in right knee: Secondary | ICD-10-CM | POA: Diagnosis not present

## 2020-01-02 DIAGNOSIS — M6281 Muscle weakness (generalized): Secondary | ICD-10-CM | POA: Diagnosis not present

## 2020-01-02 DIAGNOSIS — Z9889 Other specified postprocedural states: Secondary | ICD-10-CM | POA: Diagnosis not present

## 2020-01-05 DIAGNOSIS — Z96651 Presence of right artificial knee joint: Secondary | ICD-10-CM | POA: Diagnosis not present

## 2020-01-05 DIAGNOSIS — Z9889 Other specified postprocedural states: Secondary | ICD-10-CM | POA: Diagnosis not present

## 2020-01-05 DIAGNOSIS — M25561 Pain in right knee: Secondary | ICD-10-CM | POA: Diagnosis not present

## 2020-01-05 DIAGNOSIS — M6281 Muscle weakness (generalized): Secondary | ICD-10-CM | POA: Diagnosis not present

## 2020-01-07 DIAGNOSIS — Z9889 Other specified postprocedural states: Secondary | ICD-10-CM | POA: Diagnosis not present

## 2020-01-07 DIAGNOSIS — M25561 Pain in right knee: Secondary | ICD-10-CM | POA: Diagnosis not present

## 2020-01-07 DIAGNOSIS — M6281 Muscle weakness (generalized): Secondary | ICD-10-CM | POA: Diagnosis not present

## 2020-01-07 DIAGNOSIS — Z96651 Presence of right artificial knee joint: Secondary | ICD-10-CM | POA: Diagnosis not present

## 2020-01-12 DIAGNOSIS — Z9889 Other specified postprocedural states: Secondary | ICD-10-CM | POA: Diagnosis not present

## 2020-01-12 DIAGNOSIS — Z96651 Presence of right artificial knee joint: Secondary | ICD-10-CM | POA: Diagnosis not present

## 2020-01-12 DIAGNOSIS — M6281 Muscle weakness (generalized): Secondary | ICD-10-CM | POA: Diagnosis not present

## 2020-01-12 DIAGNOSIS — M25561 Pain in right knee: Secondary | ICD-10-CM | POA: Diagnosis not present

## 2020-01-14 DIAGNOSIS — M25561 Pain in right knee: Secondary | ICD-10-CM | POA: Diagnosis not present

## 2020-01-14 DIAGNOSIS — Z9889 Other specified postprocedural states: Secondary | ICD-10-CM | POA: Diagnosis not present

## 2020-01-14 DIAGNOSIS — Z96651 Presence of right artificial knee joint: Secondary | ICD-10-CM | POA: Diagnosis not present

## 2020-01-14 DIAGNOSIS — M6281 Muscle weakness (generalized): Secondary | ICD-10-CM | POA: Diagnosis not present

## 2020-01-19 DIAGNOSIS — Z9889 Other specified postprocedural states: Secondary | ICD-10-CM | POA: Diagnosis not present

## 2020-01-19 DIAGNOSIS — M6281 Muscle weakness (generalized): Secondary | ICD-10-CM | POA: Diagnosis not present

## 2020-01-19 DIAGNOSIS — Z96651 Presence of right artificial knee joint: Secondary | ICD-10-CM | POA: Diagnosis not present

## 2020-01-19 DIAGNOSIS — M25561 Pain in right knee: Secondary | ICD-10-CM | POA: Diagnosis not present

## 2020-01-21 DIAGNOSIS — M6281 Muscle weakness (generalized): Secondary | ICD-10-CM | POA: Diagnosis not present

## 2020-01-21 DIAGNOSIS — M25561 Pain in right knee: Secondary | ICD-10-CM | POA: Diagnosis not present

## 2020-01-21 DIAGNOSIS — Z9889 Other specified postprocedural states: Secondary | ICD-10-CM | POA: Diagnosis not present

## 2020-01-21 DIAGNOSIS — Z96651 Presence of right artificial knee joint: Secondary | ICD-10-CM | POA: Diagnosis not present

## 2021-11-01 NOTE — Progress Notes (Unsigned)
   I, Peterson Lombard, LAT, ATC acting as a scribe for Lynne Leader, MD.  Subjective:    CC: Back pain  HPI: Pt is an 81 y/o male c/o back pain ongoing since suffering a fall on 10/2. Pt was on a step ladder, trimming trees, and missed the last step and fell on his R side, mostly the R hip. Pt locates pain to the posterior aspect of the R hip and into the midline.  Patient notes he is the primary caregiver for his wife who is suffering from dementia.  She is at home by herself when he is at the doctor's office or at potential physical therapy visits.  Radiating pain: no LE numbness/tingling: no LE weakness: no- but the back feels weak Aggravates: R-side lying Treatments tried: meloxicam, cyclobenzaprine, Tylenol  Pertinent review of Systems: No fevers or chills  Relevant historical information: Constipation   Objective:    Vitals:   11/02/21 0740  BP: (!) 148/88  Pulse: 70  SpO2: 98%   General: Well Developed, well nourished, and in no acute distress.   MSK: L-spine: Nontender midline.  Tender palpation right lumbar paraspinal musculature. Decreased lumbar motion. Lower extremity strength is intact.   Using a quad cane to ambulate.  Lab and Radiology Results  X-ray report from primary care provider office shows no fractures lumbar spine or hip but does show degenerative changes lumbar spine with scoliosis and mild degenerative changes right hip.  Images not available at the time of this note.  Images report will be sent to scan.    Impression and Recommendations:    Assessment and Plan: 81 y.o. male with right low back pain occurring after a fall.  X-rays obtained yesterday at primary care provider office of lumbar spine and hip did not show fracture.  Pain is thought to be most likely lumbosacral strain and spasm..  Subtle nondisplaced fracture of the sacrum is possible but would be treated conservatively.  Plan to treat with physical therapy.  He is effectively  homebound due to the care needs of his wife so we will use home health physical therapy.  He will check back with me via phone call as needed in about a month.  I would like to minimize how frequently he needs to go to the doctor's office as he has significant care needs at home with his wife.  PDMP not reviewed this encounter. Orders Placed This Encounter  Procedures   Ambulatory referral to Home Health    Referral Priority:   Routine    Referral Type:   Home Health Care    Referral Reason:   Specialty Services Required    Requested Specialty:   [place]    Number of Visits Requested:   1   No orders of the defined types were placed in this encounter.   Discussed warning signs or symptoms. Please see discharge instructions. Patient expresses understanding.   The above documentation has been reviewed and is accurate and complete Lynne Leader, M.D.

## 2021-11-02 ENCOUNTER — Ambulatory Visit (INDEPENDENT_AMBULATORY_CARE_PROVIDER_SITE_OTHER): Payer: Medicare Other | Admitting: Family Medicine

## 2021-11-02 VITALS — BP 148/88 | HR 70 | Ht 71.0 in

## 2021-11-02 DIAGNOSIS — M545 Low back pain, unspecified: Secondary | ICD-10-CM

## 2021-11-02 DIAGNOSIS — K5901 Slow transit constipation: Secondary | ICD-10-CM | POA: Insufficient documentation

## 2021-11-02 NOTE — Patient Instructions (Addendum)
Thank you for coming in today.   Try using a heating pad  I've referred you to Home Health Physical Therapy.  Let us know if you don't hear from them in one week.   Check back as needed. We can do phone visits in the future.

## 2021-11-21 ENCOUNTER — Ambulatory Visit (INDEPENDENT_AMBULATORY_CARE_PROVIDER_SITE_OTHER): Payer: Medicare Other | Admitting: Family Medicine

## 2021-11-21 ENCOUNTER — Telehealth: Payer: Self-pay | Admitting: Family Medicine

## 2021-11-21 VITALS — BP 122/84 | HR 73 | Ht 71.0 in | Wt 164.0 lb

## 2021-11-21 DIAGNOSIS — M545 Low back pain, unspecified: Secondary | ICD-10-CM | POA: Diagnosis not present

## 2021-11-21 DIAGNOSIS — R3911 Hesitancy of micturition: Secondary | ICD-10-CM

## 2021-11-21 MED ORDER — PREDNISONE 50 MG PO TABS: 50.0000 mg | ORAL_TABLET | Freq: Every day | ORAL | 0 refills | Status: AC

## 2021-11-21 NOTE — Patient Instructions (Signed)
Thank you for coming in today.   You should hear from MRI scheduling within 1 week. If you do not hear please let me know.    Take the prednisone.   Continue the antibiotic.   Let me know if you have trouble.

## 2021-11-21 NOTE — Telephone Encounter (Signed)
Patient called stating that he started home health PT on Friday. Afterwards, he began having increased back pain. He also mentioned trouble urinating and bowl movements. Patient was seen at the [facility] on Saturday, 10/28. He was told that he had blood in his urine and given antibiotics. He is still experiencing trouble urinating, bowl movements, and back pain. Patient scheduled today at 9:15am.

## 2021-11-21 NOTE — Progress Notes (Signed)
I, Peterson Lombard, LAT, ATC acting as a scribe for Lynne Leader, MD.  James Lester is a 81 y.o. male who presents to [place] [facility] today for increased LBP. On 10/2, pt was on a step ladder, trimming trees, and missed the last step and fell on his R side, mostly the R hip. 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 Pt was last seen by Dr. Georgina Snell on 11/02/21 and home health PT was ordered. Pt called the office earlier this morning after being seen at [facility] on 11/19/21 for difficulty urinating and was prescribed Bactrim. Today, pt reports his back pain worsened later that night after his home health PT visit on Friday, 10/27. Pt reports difficulty urination and hasn't had a BM in 3 days. Pt locates pain to bilat side of his low back  w/ a "burining sensation" on the anterior aspect of bilat thighs. Pt notes occasional numbness in bilat feet/toes.   Dx testing: 11/19/21 Urin culture  Pertinent review of systems: No fevers or chills.  Positive for severe back pain, bilateral lower extremity paresthesia, worsening bowel or bladder hesitancy/incontinence.  Relevant historical information: . Patient has a significant social history concern.  He cares for his wife who has severe dementia and is effectively homebound.   Exam:  BP 122/84   Pulse 73   Ht '5\' 11"'$  (1.803 m)   Wt 164 lb (74.4 kg)   SpO2 97%   BMI 22.87 kg/m  General: Well Developed, well nourished, and in no acute distress.   MSK: L-spine: Nontender midline.  Decreased lumbar motion. Lower extremity strength mildly diminished hip flexion bilaterally 4/5.  Mildly diminished bilateral foot dorsiflexion 4/5.  Sensation is intact throughout.  Mild antalgic gait using a cane to ambulate.    Lab and Radiology Results  X-ray report from primary care provider office shows no fractures lumbar spine or hip but does show degenerative changes lumbar spine with scoliosis and mild degenerative  changes right hip.  Images not available at the time of this note.      Assessment and Plan: 81 y.o. male with worsening axial back pain with paresthesias bilateral lower extremity with no new bowel or bladder symptoms.  This is concerning for cauda equina syndrome.  His urinalysis report from urgent care is underwhelming making spine more likely than urinary system is the source of his new symptoms.  Plan for MRI lumbar spine to rule out cauda equina syndrome.  In the meantime we will prescribe prednisone. He effectively cannot go to the emergency room because he has to be present to provide for his wife who is at home with severe dementia.  Chronic problem with new symptoms uncertain prognosis.  Severe symptoms.  PDMP not reviewed this encounter. Orders Placed This Encounter  Procedures   MR Lumbar Spine Wo Contrast    Standing Status:   Future    Standing Expiration Date:   11/22/2022    Order Specific Question:   What is the patient's sedation requirement?    Answer:   No Sedation    Order Specific Question:   Does the patient have a pacemaker or implanted devices?    Answer:   No    Order Specific Question:   Preferred imaging location?    Answer:   Product/process development scientist (table limit-350lbs)   Meds ordered this encounter  Medications   predniSONE (DELTASONE) 50 MG tablet    Sig: Take 1 tablet (50  mg total) by mouth daily.    Dispense:  5 tablet    Refill:  0     Discussed warning signs or symptoms. Please see discharge instructions. Patient expresses understanding.   The above documentation has been reviewed and is accurate and complete Lynne Leader, M.D.

## 2021-11-22 ENCOUNTER — Telehealth: Payer: Self-pay | Admitting: Family Medicine

## 2021-11-22 ENCOUNTER — Ambulatory Visit (INDEPENDENT_AMBULATORY_CARE_PROVIDER_SITE_OTHER): Payer: Medicare Other

## 2021-11-22 DIAGNOSIS — M545 Low back pain, unspecified: Secondary | ICD-10-CM | POA: Diagnosis not present

## 2021-11-22 DIAGNOSIS — R3911 Hesitancy of micturition: Secondary | ICD-10-CM

## 2021-11-22 NOTE — Telephone Encounter (Signed)
I called James Lester with the result of his MRI.  He is not experiencing axial back pain at the L1 region.  He notes his urinary symptoms and bowel symptoms have improved.  After describing his MRI results and going over his symptoms we both agreed to do some watchful waiting for now.  He does not think his symptoms are bad enough to proceed with further treatment at this time.

## 2021-11-22 NOTE — Progress Notes (Signed)
As we spoke on the phone there were areas in your back that could cause pain but there is not anything that should cause your bowel or bladder to not work and since you are feeling better not worse were going to keep an eye on it.

## 2021-11-23 ENCOUNTER — Telehealth: Payer: Self-pay | Admitting: Family Medicine

## 2021-11-23 NOTE — Telephone Encounter (Signed)
Called and gave verbal for discontinue of therapy. She is going to see the patient one more time for discharge.

## 2021-11-23 NOTE — Telephone Encounter (Signed)
Received a voicemail after hours from [facility] regarding this patient.  He asked them to hold off on [place] until he was able to get results back from his MRI.  She asked for clearance to continue therapy.  Please advise. Call back # [phone]

## 2021-11-23 NOTE — Telephone Encounter (Signed)
We should discontinue physical therapy.  He is feeling a better and he has a vertebral compression fracture.
